# Patient Record
Sex: Male | Born: 1940 | Race: White | Hispanic: No | Marital: Married | Smoking: Current every day smoker
Health system: Southern US, Community
[De-identification: ages and names within clinical notes are randomized; demographics above are authoritative.]

## PROBLEM LIST (undated history)

## (undated) DIAGNOSIS — M1990 Unspecified osteoarthritis, unspecified site: Secondary | ICD-10-CM

## (undated) DIAGNOSIS — I639 Cerebral infarction, unspecified: Secondary | ICD-10-CM

## (undated) DIAGNOSIS — I10 Essential (primary) hypertension: Secondary | ICD-10-CM

## (undated) DIAGNOSIS — F419 Anxiety disorder, unspecified: Secondary | ICD-10-CM

## (undated) HISTORY — PX: OTHER SURGICAL HISTORY: SHX169

---

## 1993-05-18 DIAGNOSIS — I639 Cerebral infarction, unspecified: Secondary | ICD-10-CM

## 1993-05-18 HISTORY — DX: Cerebral infarction, unspecified: I63.9

## 2001-12-19 ENCOUNTER — Ambulatory Visit (HOSPITAL_COMMUNITY): Admission: RE | Admit: 2001-12-19 | Discharge: 2001-12-19 | Payer: Self-pay | Admitting: Pulmonary Disease

## 2002-05-11 ENCOUNTER — Emergency Department (HOSPITAL_COMMUNITY): Admission: EM | Admit: 2002-05-11 | Discharge: 2002-05-11 | Payer: Self-pay | Admitting: Emergency Medicine

## 2003-10-26 ENCOUNTER — Ambulatory Visit (HOSPITAL_COMMUNITY): Admission: RE | Admit: 2003-10-26 | Discharge: 2003-10-26 | Payer: Self-pay | Admitting: Orthopaedic Surgery

## 2006-11-18 ENCOUNTER — Ambulatory Visit: Payer: Self-pay | Admitting: Orthopedic Surgery

## 2006-11-25 ENCOUNTER — Ambulatory Visit (HOSPITAL_COMMUNITY): Admission: RE | Admit: 2006-11-25 | Discharge: 2006-11-25 | Payer: Self-pay | Admitting: Orthopedic Surgery

## 2006-12-01 ENCOUNTER — Ambulatory Visit: Payer: Self-pay | Admitting: Orthopedic Surgery

## 2006-12-06 ENCOUNTER — Encounter (HOSPITAL_COMMUNITY): Admission: RE | Admit: 2006-12-06 | Discharge: 2007-01-05 | Payer: Self-pay | Admitting: Orthopedic Surgery

## 2006-12-30 ENCOUNTER — Encounter (INDEPENDENT_AMBULATORY_CARE_PROVIDER_SITE_OTHER): Payer: Self-pay | Admitting: Pulmonary Disease

## 2006-12-30 ENCOUNTER — Ambulatory Visit: Payer: Self-pay | Admitting: Orthopedic Surgery

## 2007-03-14 ENCOUNTER — Ambulatory Visit (HOSPITAL_COMMUNITY): Admission: RE | Admit: 2007-03-14 | Discharge: 2007-03-14 | Payer: Self-pay | Admitting: Pulmonary Disease

## 2009-02-20 DIAGNOSIS — Z8679 Personal history of other diseases of the circulatory system: Secondary | ICD-10-CM | POA: Insufficient documentation

## 2009-02-21 ENCOUNTER — Ambulatory Visit: Payer: Self-pay | Admitting: Orthopedic Surgery

## 2009-02-21 DIAGNOSIS — M7020 Olecranon bursitis, unspecified elbow: Secondary | ICD-10-CM

## 2009-08-29 ENCOUNTER — Ambulatory Visit (HOSPITAL_COMMUNITY): Admission: RE | Admit: 2009-08-29 | Discharge: 2009-08-29 | Payer: Self-pay | Admitting: Pulmonary Disease

## 2009-10-02 ENCOUNTER — Ambulatory Visit: Payer: Self-pay | Admitting: Vascular Surgery

## 2010-05-09 ENCOUNTER — Ambulatory Visit: Payer: Self-pay | Admitting: Vascular Surgery

## 2010-09-30 NOTE — Assessment & Plan Note (Signed)
OFFICE VISIT   Dustin Huang, Dustin Huang  DOB:  1940-11-07                                       10/02/2009  ZOXWR#:[id number]   CHIEF COMPLAINT:  Leg pain.   HISTORY OF PRESENT ILLNESS:  The patient is a 70 year old male referred  by Dr. Juanetta Gosling for bilateral lower extremity pain.  The patient states  that both legs begin cramping after walking approximately 30 yards.  The  problem has been going on for 4-5 years and has become progressively  worse over time.  He denies rest pain.  He denies any ulcerations on his  feet.   CHRONIC MEDICAL PROBLEMS:  Include borderline elevated cholesterol and  hypertension, both of these are followed by Dr. Juanetta Gosling and currently  controlled.   PAST SURGICAL HISTORY:  He had some trauma to two fingers on his right  hand.  Otherwise no other operations.   FAMILY HISTORY:  Unremarkable.   SOCIAL HISTORY:  He is married.  He has four children.  He is a retired  Merchandiser, retail.  He currently smokes one pack of cigarettes per day and has  smoked for most of his life.   REVIEW OF SYSTEMS:  A 12 point review of systems was performed with the  patient today.  Please see intake referral form for details regarding  that.   MEDICATIONS:  1. Include verapamil 240 mg once a day.  2. Triamterene 25 mg once a day.  3. Xanax 0.5 mg t.i.d.  4. Aspirin 81 mg once a day.   ALLERGIES:  He has listed an allergy to mycins which cause a rash.   PHYSICAL EXAM:  Blood pressure is 146/74 in the left arm, heart rate is  51 and regular, respirations 18.  HEENT:  Unremarkable.  Neck:  Has 2+  carotid pulses without bruit.  Chest:  Clear to auscultation.  Cardiac:  Regular rate and rhythm without murmur.  Abdomen:  Soft, nontender,  nondistended.  No masses.  Extremities:  He has 2+ brachial and radial  pulses bilaterally.  He has 2+ femoral pulses bilaterally.  He has  absent popliteal and pedal pulses bilaterally.  Skin:  Has no open  ulcers or  rashes.  Neurologic:  Exam shows symmetric upper extremity and  lower extremity motor strength which is 5/5.  Musculoskeletal:  Exam  shows no major joint deformities.   I reviewed his bilateral ABIs from 08/29/2009 performed at [facility].  These were 0.59 on the right, 0.67 on the left.  Waveform  findings were possibly consistent with aortoiliac disease.   I had a lengthy discussion with the patient today regarding risk factor  modification.  Currently he has opted for conservative management with  Pletal 100 mg twice daily and a walking program of 30 minutes daily to  see whether or not he can symptoms without an intervention.  He will  follow with me in six months' time for repeat ABIs.  He will follow up  sooner if he wishes to undergo an arteriogram with possible angioplasty  and stenting if his symptoms worsen.  Greater than 3 minutes today were  spent regarding smoking cessation counseling.     Janetta Hora. Fields, MD  Electronically Signed   CEF/MEDQ  D:  10/02/2009  T:  10/03/2009  Job:  [id number]   cc:   Ramon Dredge  Patrice Paradise, Huang.D.

## 2011-07-07 ENCOUNTER — Other Ambulatory Visit (HOSPITAL_COMMUNITY): Payer: Self-pay | Admitting: Pulmonary Disease

## 2011-07-07 DIAGNOSIS — R52 Pain, unspecified: Secondary | ICD-10-CM

## 2011-07-10 ENCOUNTER — Ambulatory Visit (HOSPITAL_COMMUNITY)
Admission: RE | Admit: 2011-07-10 | Discharge: 2011-07-10 | Disposition: A | Discharge status: Home / Self Care | Payer: Medicare Other | Source: Ambulatory Visit | Attending: Pulmonary Disease | Admitting: Pulmonary Disease

## 2011-07-10 DIAGNOSIS — R109 Unspecified abdominal pain: Secondary | ICD-10-CM | POA: Insufficient documentation

## 2011-07-10 DIAGNOSIS — R52 Pain, unspecified: Secondary | ICD-10-CM

## 2011-07-10 DIAGNOSIS — I10 Essential (primary) hypertension: Secondary | ICD-10-CM | POA: Insufficient documentation

## 2013-05-23 ENCOUNTER — Other Ambulatory Visit (HOSPITAL_COMMUNITY): Payer: Self-pay | Admitting: Pulmonary Disease

## 2013-05-23 DIAGNOSIS — M545 Low back pain, unspecified: Secondary | ICD-10-CM

## 2013-05-25 ENCOUNTER — Ambulatory Visit (HOSPITAL_COMMUNITY)
Admission: RE | Admit: 2013-05-25 | Discharge: 2013-05-25 | Disposition: A | Discharge status: Home / Self Care | Payer: Medicare PPO | Source: Ambulatory Visit | Attending: Pulmonary Disease | Admitting: Pulmonary Disease

## 2013-05-25 DIAGNOSIS — M47817 Spondylosis without myelopathy or radiculopathy, lumbosacral region: Secondary | ICD-10-CM | POA: Insufficient documentation

## 2013-05-25 DIAGNOSIS — C7952 Secondary malignant neoplasm of bone marrow: Principal | ICD-10-CM

## 2013-05-25 DIAGNOSIS — M51379 Other intervertebral disc degeneration, lumbosacral region without mention of lumbar back pain or lower extremity pain: Secondary | ICD-10-CM | POA: Insufficient documentation

## 2013-05-25 DIAGNOSIS — M545 Low back pain, unspecified: Secondary | ICD-10-CM | POA: Insufficient documentation

## 2013-05-25 DIAGNOSIS — R209 Unspecified disturbances of skin sensation: Secondary | ICD-10-CM | POA: Insufficient documentation

## 2013-05-25 DIAGNOSIS — C7951 Secondary malignant neoplasm of bone: Secondary | ICD-10-CM | POA: Insufficient documentation

## 2013-05-25 DIAGNOSIS — M5137 Other intervertebral disc degeneration, lumbosacral region: Secondary | ICD-10-CM | POA: Insufficient documentation

## 2013-06-02 ENCOUNTER — Other Ambulatory Visit (HOSPITAL_COMMUNITY): Payer: Self-pay | Admitting: Pulmonary Disease

## 2013-06-02 DIAGNOSIS — C419 Malignant neoplasm of bone and articular cartilage, unspecified: Secondary | ICD-10-CM

## 2013-06-07 ENCOUNTER — Ambulatory Visit (HOSPITAL_COMMUNITY)
Admission: RE | Admit: 2013-06-07 | Discharge: 2013-06-07 | Disposition: A | Discharge status: Home / Self Care | Payer: Medicare PPO | Source: Ambulatory Visit | Attending: Pulmonary Disease | Admitting: Pulmonary Disease

## 2013-06-07 ENCOUNTER — Ambulatory Visit (HOSPITAL_COMMUNITY): Payer: Medicare PPO

## 2013-06-07 DIAGNOSIS — R918 Other nonspecific abnormal finding of lung field: Secondary | ICD-10-CM | POA: Insufficient documentation

## 2013-06-07 DIAGNOSIS — C787 Secondary malignant neoplasm of liver and intrahepatic bile duct: Secondary | ICD-10-CM | POA: Insufficient documentation

## 2013-06-07 DIAGNOSIS — C419 Malignant neoplasm of bone and articular cartilage, unspecified: Secondary | ICD-10-CM

## 2013-06-07 DIAGNOSIS — C7951 Secondary malignant neoplasm of bone: Secondary | ICD-10-CM | POA: Insufficient documentation

## 2013-06-07 DIAGNOSIS — M949 Disorder of cartilage, unspecified: Secondary | ICD-10-CM

## 2013-06-07 DIAGNOSIS — C801 Malignant (primary) neoplasm, unspecified: Secondary | ICD-10-CM | POA: Insufficient documentation

## 2013-06-07 DIAGNOSIS — R599 Enlarged lymph nodes, unspecified: Secondary | ICD-10-CM | POA: Insufficient documentation

## 2013-06-07 DIAGNOSIS — M899 Disorder of bone, unspecified: Secondary | ICD-10-CM | POA: Insufficient documentation

## 2013-06-07 DIAGNOSIS — C7952 Secondary malignant neoplasm of bone marrow: Principal | ICD-10-CM

## 2013-06-07 DIAGNOSIS — C771 Secondary and unspecified malignant neoplasm of intrathoracic lymph nodes: Secondary | ICD-10-CM | POA: Insufficient documentation

## 2013-06-07 DIAGNOSIS — J438 Other emphysema: Secondary | ICD-10-CM | POA: Insufficient documentation

## 2013-06-07 MED ORDER — SODIUM CHLORIDE 0.9 % IJ SOLN
INTRAMUSCULAR | Status: AC
  Filled 2013-06-07: qty 500

## 2013-06-07 MED ORDER — IOHEXOL 300 MG/ML  SOLN
100.0000 mL | Freq: Once | INTRAMUSCULAR | Status: AC | PRN
  Administered 2013-06-07: 100 mL via INTRAVENOUS

## 2013-06-12 ENCOUNTER — Other Ambulatory Visit: Payer: Self-pay | Admitting: Radiology

## 2013-06-12 ENCOUNTER — Other Ambulatory Visit (HOSPITAL_COMMUNITY): Payer: Self-pay | Admitting: Pulmonary Disease

## 2013-06-12 ENCOUNTER — Encounter (HOSPITAL_COMMUNITY): Payer: Self-pay | Admitting: Pharmacy Technician

## 2013-06-12 DIAGNOSIS — C799 Secondary malignant neoplasm of unspecified site: Secondary | ICD-10-CM

## 2013-06-13 ENCOUNTER — Other Ambulatory Visit (HOSPITAL_COMMUNITY): Payer: Self-pay | Admitting: Radiology

## 2013-06-14 ENCOUNTER — Other Ambulatory Visit (HOSPITAL_COMMUNITY): Payer: Self-pay | Admitting: Pulmonary Disease

## 2013-06-14 ENCOUNTER — Ambulatory Visit (HOSPITAL_COMMUNITY)
Admission: RE | Admit: 2013-06-14 | Discharge: 2013-06-14 | Disposition: A | Discharge status: Home / Self Care | Payer: Medicare PPO | Source: Ambulatory Visit | Attending: Pulmonary Disease | Admitting: Pulmonary Disease

## 2013-06-14 ENCOUNTER — Encounter (HOSPITAL_COMMUNITY): Payer: Self-pay

## 2013-06-14 DIAGNOSIS — C799 Secondary malignant neoplasm of unspecified site: Secondary | ICD-10-CM

## 2013-06-14 DIAGNOSIS — C787 Secondary malignant neoplasm of liver and intrahepatic bile duct: Secondary | ICD-10-CM | POA: Insufficient documentation

## 2013-06-14 DIAGNOSIS — C7952 Secondary malignant neoplasm of bone marrow: Secondary | ICD-10-CM

## 2013-06-14 DIAGNOSIS — C801 Malignant (primary) neoplasm, unspecified: Secondary | ICD-10-CM | POA: Insufficient documentation

## 2013-06-14 DIAGNOSIS — C7951 Secondary malignant neoplasm of bone: Secondary | ICD-10-CM | POA: Insufficient documentation

## 2013-06-14 HISTORY — DX: Essential (primary) hypertension: I10

## 2013-06-14 HISTORY — DX: Anxiety disorder, unspecified: F41.9

## 2013-06-14 LAB — PROTIME-INR
INR: 0.98 (ref 0.00–1.49)
PROTHROMBIN TIME: 12.8 s (ref 11.6–15.2)

## 2013-06-14 LAB — CBC
HEMATOCRIT: 38.3 % — AB (ref 39.0–52.0)
HEMOGLOBIN: 13.3 g/dL (ref 13.0–17.0)
MCH: 31 pg (ref 26.0–34.0)
MCHC: 34.7 g/dL (ref 30.0–36.0)
MCV: 89.3 fL (ref 78.0–100.0)
Platelets: 134 10*3/uL — ABNORMAL LOW (ref 150–400)
RBC: 4.29 MIL/uL (ref 4.22–5.81)
RDW: 13.8 % (ref 11.5–15.5)
WBC: 5.3 10*3/uL (ref 4.0–10.5)

## 2013-06-14 LAB — APTT: APTT: 27 s (ref 24–37)

## 2013-06-14 MED ORDER — MIDAZOLAM HCL 2 MG/2ML IJ SOLN
INTRAMUSCULAR | Status: AC
  Filled 2013-06-14: qty 4

## 2013-06-14 MED ORDER — FENTANYL CITRATE 0.05 MG/ML IJ SOLN
INTRAMUSCULAR | Status: AC
  Filled 2013-06-14: qty 4

## 2013-06-14 MED ORDER — FENTANYL CITRATE 0.05 MG/ML IJ SOLN
INTRAMUSCULAR | Status: AC | PRN
  Administered 2013-06-14 (×2): 25 ug via INTRAVENOUS

## 2013-06-14 MED ORDER — HYDROCODONE-ACETAMINOPHEN 5-325 MG PO TABS: 1.0000 | ORAL_TABLET | ORAL | Status: DC | PRN

## 2013-06-14 MED ORDER — MIDAZOLAM HCL 2 MG/2ML IJ SOLN
INTRAMUSCULAR | Status: AC | PRN
  Administered 2013-06-14: 1 mg via INTRAVENOUS

## 2013-06-14 MED ORDER — SODIUM CHLORIDE 0.9 % IV SOLN
Freq: Once | INTRAVENOUS | Status: AC
  Administered 2013-06-14: 13:00:00 via INTRAVENOUS

## 2013-06-14 NOTE — Procedures (Signed)
US liver lesion core 31D x3 No complication No blood loss. See complete dictation in [facility].

## 2013-06-14 NOTE — Progress Notes (Signed)
Pt received from cath procedure alert and denies any discomfort.

## 2013-06-14 NOTE — H&P (Signed)
Dustin Huang is an 72 y.o. male.   Chief Complaint: pt was suffering from back pain and weakness x weeks Work up included spinal MRI; CT chest/abd Revealed R lobe lung mass; Lymphadenopathy; Liver lesions; sternal lesion Now scheduled for liver lesion biopsy  HPI: smoker; HTN  Past Medical History  Diagnosis Date  . Hypertension   . Anxiety     History reviewed. No pertinent past surgical history.  History reviewed. No pertinent family history. Social History:  reports that he has been smoking.  He does not have any smokeless tobacco history on file. His alcohol and drug histories are not on file.  Allergies:  Allergies  Allergen Reactions  . Amoxicillin Nausea And Vomiting     (Not in a hospital admission)  No results found for this or any previous visit (from the past 48 hour(s)). No results found.  Review of Systems  Constitutional: Negative for fever and weight loss.  Respiratory: Positive for cough.   Cardiovascular: Negative for chest pain.  Gastrointestinal: Negative for nausea, vomiting and abdominal pain.  Musculoskeletal: Positive for back pain.  Neurological: Positive for weakness. Negative for dizziness and headaches.  Psychiatric/Behavioral: Positive for substance abuse.       Smoker    Blood pressure 122/58, pulse 84, temperature 97.8 F (36.6 C), temperature source Oral, resp. rate 20, height 5\' 8"  (1.727 m), weight 163 lb (73.936 kg), SpO2 98.00%. Physical Exam  Constitutional: He is oriented to person, place, and time. He appears well-developed and well-nourished.  Cardiovascular: Normal rate and regular rhythm.   No murmur heard. Respiratory: Effort normal and breath sounds normal. He has no wheezes.  GI: Soft. Bowel sounds are normal. There is no tenderness.  Musculoskeletal: Normal range of motion.  Neurological: He is alert and oriented to person, place, and time.  Skin: Skin is warm and dry.  Psychiatric: He has a normal mood and affect.  His behavior is normal. Judgment and thought content normal.     Assessment/Plan Back pain; weakness Work up reveals RUL mass; LAN; liver mets; sternal lesion Scheduled now for liver lesion bx Pt aware of procedure benefits and risks and agreeable to proceed Consent signed and in chart  Dustin Huang A 06/14/2013, 1:09 PM

## 2013-06-14 NOTE — Progress Notes (Signed)
Discharge instruction given per MD order.  Pt and CG  Verbalize understanding.  Pt to car via wheelchair.

## 2013-06-14 NOTE — Discharge Instructions (Signed)
Liver Biopsy °Care After °Refer to this sheet in the next few weeks. These discharge instructions provide you with general information on caring for yourself after you leave the hospital. Your caregiver may also give you specific instructions. Your treatment has been planned according to the most current medical practices available, but unavoidable complications sometimes occur. If you have any problems or questions after discharge, please call your caregiver. °HOME CARE INSTRUCTIONS  °· You should rest for 1 to 2 days or as instructed. °· If you go home the same day as your procedure (outpatient), have a responsible adult take you home and stay with you overnight. °· Do not lift more than 5 pounds or play contact sports for 2 weeks. °· Do not drive for 24 hours after this test. °· Do not take medicine containing aspirin or drink alcohol for 1 week after this test. °· Change bandages (dressings) as directed. °· Only take over-the-counter or prescription medicines for pain, discomfort, or fever as directed by your caregiver. °OBTAINING YOUR TEST RESULTS °Not all test results are available during your visit. If your test results are not back during the visit, make an appointment with your caregiver to find out the results. Do not assume everything is normal if you have not heard from your caregiver or the medical facility. It is important for you to follow up on all of your test results. °SEEK MEDICAL CARE IF:  °· You have increased bleeding (more than a small spot) from the biopsy site. °· You have redness, swelling, or increasing pain in the biopsy site. °· You have an oral temperature above 102° F (38.9° C). °SEEK IMMEDIATE MEDICAL CARE IF:  °· You develop swelling or pain in the belly (abdomen). °· You develop a rash. °· You have difficulty breathing, feel short of breath, or feel faint. °· You develop any reaction or side effects to medicines given. °MAKE SURE YOU:  °· Understand these instructions. °· Will watch  your condition. °· Will get help right away if you are not doing well or get worse. °Document Released: 11/21/2004 Document Revised: 07/27/2011 Document Reviewed: 12/15/2007 °ExitCare® Patient Information ©2014 [employer]. ° °

## 2013-06-23 ENCOUNTER — Encounter (HOSPITAL_COMMUNITY): Payer: Self-pay | Admitting: Pharmacy Technician

## 2013-06-23 NOTE — H&P (Signed)
  NTS SOAP Note  Vital Signs:  Vitals as of: 12/19/2547: Systolic 826: Diastolic 76: Heart Rate 415: Temp 99.1F: Height 59ft 8in: Weight 160Lbs 0 Ounces: Pain Level 8: BMI 24.33  BMI : 24.33 kg/m2  Subjective: This 73 Years 47 Months old Male presents for a portacath.  Has metastatic small cell lung carcinoma.  Needs portacath for chemotherapy.  Review of Symptoms:  Constitutional:  fatigue Head:unremarkable    Eyes:  pain bilateral Nose/Mouth/Throat:unremarkable Cardiovascular:  unremarkable   Respiratory:  wheezing,cough Gastrointestinal:  unremarkable   Genitourinary:unremarkable       joint and neck pain Skin:unremarkable Hematolgic/Lymphatic:unremarkable     Allergic/Immunologic:unremarkable     Past Medical History:    Reviewed  Past Medical History  Surgical History: none Medical Problems: , metastatic lung cancer, htn Allergies: amoxicillin Medications: verapamil, triamterene, xanax, benazapril, baby asa, hydrocodone, dexamethasone   Social History:Reviewed  Social History  Preferred Language: English Race:  White Ethnicity: Not Hispanic / Latino Age: 73 Years 4 Months Marital Status:  S Alcohol: no Recreational drug(s): no   Smoking Status: Current every day smoker reviewed on 06/22/2013 Started Date: 05/18/1960 Packs per day: 1.00 Functional Status reviewed on 06/22/2013 ------------------------------------------------ Bathing: Normal Cooking: Normal Dressing: Normal Driving: Normal Eating: Normal Managing Meds: Normal Oral Care: Normal Shopping: Normal Toileting: Normal Transferring: Normal Walking: Normal Cognitive Status reviewed on 06/22/2013 ------------------------------------------------ Attention: Normal Decision Making: Normal Language: Normal Memory: Normal Motor: Normal Perception: Normal Problem Solving: Normal Visual and Spatial: Normal   Family History:  Reviewed  Family  Health History Family History is Unknown    Objective Information: General:  Well appearing, well nourished in no distress. Heart:  RRR, no murmur     Distant lung sounds noted.  Assessment:Metastatic lung carcinoma, need for central venous access  Diagnoses: 162.9 Carcinoma of lung (Malignant neoplasm of unspecified part of unspecified bronchus or lung)  Procedures: 83094 - OFFICE OUTPATIENT NEW 20 MINUTES    Plan:  Scheduled for portacath insertion on 06/28/13.   Patient Education:Alternative treatments to surgery were discussed with patient (and family).  Risks and benefits  of procedure including bleeding, infection, and pneumothorax were fully explained to the patient (and family) who gave informed consent. Patient/family questions were addressed.  Follow-up:Pending Surgery

## 2013-06-26 ENCOUNTER — Encounter (HOSPITAL_COMMUNITY)
Admission: RE | Admit: 2013-06-26 | Discharge: 2013-06-26 | Disposition: A | Discharge status: Home / Self Care | Payer: Medicare PPO | Source: Ambulatory Visit | Attending: General Surgery | Admitting: General Surgery

## 2013-06-26 ENCOUNTER — Encounter (HOSPITAL_COMMUNITY): Payer: Self-pay

## 2013-06-26 ENCOUNTER — Other Ambulatory Visit: Payer: Self-pay

## 2013-06-26 HISTORY — DX: Cerebral infarction, unspecified: I63.9

## 2013-06-26 HISTORY — DX: Unspecified osteoarthritis, unspecified site: M19.90

## 2013-06-26 NOTE — Patient Instructions (Signed)
Dustin Huang  06/26/2013   Your procedure is scheduled on:  06/28/2013  Report to [facility] at  1050  AM.  Call this number if you have problems the morning of surgery: [phone]   Remember:   Do not eat food or drink liquids after midnight.   Take these medicines the morning of surgery with A SIP OF WATER:  Xanax, benazepril, decadron, hydrocodone, triamterene, verapamil   Do not wear jewelry, make-up or nail polish.  Do not wear lotions, powders, or perfumes.   Do not shave 48 hours prior to surgery. Men may shave face and neck.  Do not bring valuables to the hospital.  [facility] is not responsible for any belongings or valuables.               Contacts, dentures or bridgework may not be worn into surgery.  Leave suitcase in the car. After surgery it may be brought to your room.  For patients admitted to the hospital, discharge time is determined by your treatment team.               Patients discharged the day of surgery will not be allowed to drive home.  Name and phone number of your driver: family  Special Instructions: Shower using CHG 2 nights before surgery and the night before surgery.  If you shower the day of surgery use CHG.  Use special wash - you have one bottle of CHG for all showers.  You should use approximately 1/3 of the bottle for each shower.   Please read over the following fact sheets that you were given: Pain Booklet, Coughing and Deep Breathing, Surgical Site Infection Prevention, Anesthesia Post-op Instructions and Care and Recovery After Surgery Implanted Port Insertion An implanted port is a central line that has a round shape and is placed under the skin. It is used as a long-term IV access for:   Medicines, such as chemotherapy.   Fluids.   Liquid nutrition, such as total parenteral nutrition (TPN).   Blood samples.  LET [facility] CARE PROVIDER KNOW ABOUT:  Allergies to food or medicine.   Medicines taken, including  vitamins, herbs, eye drops, creams, and over-the-counter medicines.   Any allergies to heparin.  Use of steroids (by mouth or creams).   Previous problems with anesthetics or numbing medicines.   History of bleeding problems or blood clots.   Previous surgery.   Other health problems, including diabetes and kidney problems.   Possibility of pregnancy, if this applies. RISKS AND COMPLICATIONS  Damage to the blood vessel, bruising, or bleeding at the puncture site.   Infection.  Blood clot in the vessel that the port is in.  Breakdown of the skin over your port.  Very rarely a person may develop a condition called a pneumothorax, a collection of air in the chest that may cause one of the lungs to collapse. The placement of these catheters with the appropriate imaging guidance significantly decreases the risk of a pneumothorax.  BEFORE THE PROCEDURE   Your health care provider may want you to have blood tests. These tests can help tell how well your kidneys and liver are working. They can also show how well your blood clots.   If you take blood thinners (anticoagulant medicines), ask your health care provider when you should stop taking them.   Make arrangements for someone to drive you home. This is necessary if you have been sedated for  your procedure.  PROCEDURE  Port insertion usually takes about 30 45 minutes.   An IV needle will be inserted in your arm. Medicine for pain and medicine to help relax you (sedative) will flow directly into your body through this needle.   You will lie on an exam table, and you will be connected to monitors to keep track of your heart rate, blood pressure, and breathing throughout the procedure.  An oxygen monitoring device may be attached to your finger. Oxygen will be given.   Everything is kept as germ free (sterile) as possible during the procedure. The skin near the point of the incision will be cleaned with antiseptic, and  the area will be draped with sterile towels. The skin and deeper tissues over the port area will be made numb with a local anesthetic.  Two small cuts (incisions) will be made in the skin to insert the port. One will be made in the neck to obtain access to the vein where the catheter will lie.   Because the port reservoir is placed under the skin, a small skin incision is made in the upper chest, and a small pocket for the port is made under the skin. The catheter connected to the port tunnels to a large central vein in the chest. A small, raised area remains on your body at the site of the reservoir when the procedure is complete.  The port placement is done under imaging guidance to ensure the proper placement.  The reservoir has a silicone covering that can be punctured with a special needle.   The port is flushed with normal saline, and blood is drawn to make sure it is working properly.  There is nothing remaining outside the skin when the procedure is finished.   Incisions are held together by stitches, surgical glue, or a special tape.  AFTER THE PROCEDURE  You will stay in a recovery area until the anesthesia has worn off. Your blood pressure and pulse will be checked.  A final chest X-ray is taken to check the placement of the port and that there is no injury to your lung.  If there are no problems, you should be able to go home after the procedure.  Document Released: 02/22/2013 Document Reviewed: 01/09/2013 [facility] Patient Information 2014 [place], [place]. Implanted [facility] Guide An implanted port is a type of central line that is placed under the skin. Central lines are used to provide IV access when treatment or nutrition needs to be given through a person's veins. Implanted ports are used for long-term IV access. An implanted port may be placed because:   You need IV medicine that would be irritating to the small veins in your hands or arms.   You need long-term  IV medicines, such as antibiotics.   You need IV nutrition for a long period.   You need frequent blood draws for lab tests.   You need dialysis.  Implanted ports are usually placed in the chest area, but they can also be placed in the upper arm, the abdomen, or the leg. An implanted port has two main parts:   Reservoir. The reservoir is round and will appear as a small, raised area under your skin. The reservoir is the part where a needle is inserted to give medicines or draw blood.   Catheter. The catheter is a thin, flexible tube that extends from the reservoir. The catheter is placed into a large vein. Medicine that is inserted into the  reservoir goes into the catheter and then into the vein.  HOW WILL I CARE FOR MY INCISION SITE? Do not get the incision site wet. Bathe or shower as directed by your health care provider.  HOW IS MY PORT ACCESSED? Special steps must be taken to access the port:   Before the port is accessed, a numbing cream can be placed on the skin. This helps numb the skin over the port site.   Your health care provider uses a sterile technique to access the port.  Your health care provider must put on a mask and sterile gloves.  The skin over your port is cleaned carefully with an antiseptic and allowed to dry.  The port is gently pinched between sterile gloves, and a needle is inserted into the port.  Only "non-coring" port needles should be used to access the port. Once the port is accessed, a blood return should be checked. This helps ensure that the port is in the vein and is not clogged.   If your port needs to remain accessed for a constant infusion, a clear (transparent) bandage will be placed over the needle site. The bandage and needle will need to be changed every week, or as directed by your health care provider.   Keep the bandage covering the needle clean and dry. Do not get it wet. Follow your health care provider's instructions on how to  take a shower or bath while the port is accessed.   If your port does not need to stay accessed, no bandage is needed over the port.  WHAT IS FLUSHING? Flushing helps keep the port from getting clogged. Follow your health care provider's instructions on how and when to flush the port. Ports are usually flushed with saline solution or a medicine called heparin. The need for flushing will depend on how the port is used.   If the port is used for intermittent medicines or blood draws, the port will need to be flushed:   After medicines have been given.   After blood has been drawn.   As part of routine maintenance.   If a constant infusion is running, the port may not need to be flushed.  HOW LONG WILL MY PORT STAY IMPLANTED? The port can stay in for as long as your health care provider thinks it is needed. When it is time for the port to come out, surgery will be done to remove it. The procedure is similar to the one performed when the port was put in.  WHEN SHOULD I SEEK IMMEDIATE MEDICAL CARE? When you have an implanted port, you should seek immediate medical care if:   You notice a bad smell coming from the incision site.   You have swelling, redness, or drainage at the incision site.   You have more swelling or pain at the port site or the surrounding area.   You have a fever that is not controlled with medicine. Document Released: 05/04/2005 Document Revised: 02/22/2013 Document Reviewed: 01/09/2013 [facility] Patient Information 2014 [place]. PATIENT INSTRUCTIONS POST-ANESTHESIA  IMMEDIATELY FOLLOWING SURGERY:  Do not drive or operate machinery for the first twenty four hours after surgery.  Do not make any important decisions for twenty four hours after surgery or while taking narcotic pain medications or sedatives.  If you develop intractable nausea and vomiting or a severe headache please notify your doctor immediately.  FOLLOW-UP:  Please make an appointment  with your surgeon as instructed. You do not need to follow  up with anesthesia unless specifically instructed to do so.  WOUND CARE INSTRUCTIONS (if applicable):  Keep a dry clean dressing on the anesthesia/puncture wound site if there is drainage.  Once the wound has quit draining you may leave it open to air.  Generally you should leave the bandage intact for twenty four hours unless there is drainage.  If the epidural site drains for more than 36-48 hours please call the anesthesia department.  QUESTIONS?:  Please feel free to call your physician or the hospital operator if you have any questions, and they will be happy to assist you.

## 2013-06-28 ENCOUNTER — Ambulatory Visit (HOSPITAL_COMMUNITY): Payer: Medicare PPO

## 2013-06-28 ENCOUNTER — Encounter (HOSPITAL_COMMUNITY): Payer: Self-pay

## 2013-06-28 ENCOUNTER — Ambulatory Visit (HOSPITAL_COMMUNITY)
Admission: RE | Admit: 2013-06-28 | Discharge: 2013-06-28 | Disposition: A | Discharge status: Home / Self Care | Payer: Medicare PPO | Source: Ambulatory Visit | Attending: General Surgery | Admitting: General Surgery

## 2013-06-28 ENCOUNTER — Encounter (HOSPITAL_COMMUNITY)
Admission: RE | Disposition: A | Discharge status: Home / Self Care | Payer: Self-pay | Source: Ambulatory Visit | Attending: General Surgery

## 2013-06-28 ENCOUNTER — Encounter (HOSPITAL_COMMUNITY): Payer: Medicare PPO | Admitting: Anesthesiology

## 2013-06-28 ENCOUNTER — Ambulatory Visit (HOSPITAL_COMMUNITY): Payer: Medicare PPO | Admitting: Anesthesiology

## 2013-06-28 DIAGNOSIS — I10 Essential (primary) hypertension: Secondary | ICD-10-CM | POA: Insufficient documentation

## 2013-06-28 DIAGNOSIS — Z7982 Long term (current) use of aspirin: Secondary | ICD-10-CM | POA: Insufficient documentation

## 2013-06-28 DIAGNOSIS — C787 Secondary malignant neoplasm of liver and intrahepatic bile duct: Secondary | ICD-10-CM | POA: Insufficient documentation

## 2013-06-28 DIAGNOSIS — Z79899 Other long term (current) drug therapy: Secondary | ICD-10-CM | POA: Insufficient documentation

## 2013-06-28 DIAGNOSIS — C3490 Malignant neoplasm of unspecified part of unspecified bronchus or lung: Secondary | ICD-10-CM | POA: Insufficient documentation

## 2013-06-28 HISTORY — PX: PORTACATH PLACEMENT: SHX2246

## 2013-06-28 SURGERY — INSERTION, TUNNELED CENTRAL VENOUS DEVICE, WITH PORT
Anesthesia: Monitor Anesthesia Care | Site: Chest | Laterality: Left

## 2013-06-28 MED ORDER — VANCOMYCIN HCL IN DEXTROSE 1-5 GM/200ML-% IV SOLN
1000.0000 mg | INTRAVENOUS | Status: AC
  Administered 2013-06-28: 1000 mg via INTRAVENOUS

## 2013-06-28 MED ORDER — LIDOCAINE HCL (PF) 1 % IJ SOLN
INTRAMUSCULAR | Status: AC
  Filled 2013-06-28: qty 30

## 2013-06-28 MED ORDER — MIDAZOLAM HCL 2 MG/2ML IJ SOLN
1.0000 mg | INTRAMUSCULAR | Status: DC | PRN
  Administered 2013-06-28: 2 mg via INTRAVENOUS

## 2013-06-28 MED ORDER — HEPARIN SOD (PORK) LOCK FLUSH 100 UNIT/ML IV SOLN
INTRAVENOUS | Status: DC | PRN
  Administered 2013-06-28: 500 [IU] via INTRAVENOUS

## 2013-06-28 MED ORDER — HEPARIN SOD (PORK) LOCK FLUSH 100 UNIT/ML IV SOLN
INTRAVENOUS | Status: AC
  Filled 2013-06-28: qty 5

## 2013-06-28 MED ORDER — FENTANYL CITRATE 0.05 MG/ML IJ SOLN
INTRAMUSCULAR | Status: AC
  Filled 2013-06-28: qty 2

## 2013-06-28 MED ORDER — VANCOMYCIN HCL IN DEXTROSE 1-5 GM/200ML-% IV SOLN
INTRAVENOUS | Status: AC
  Filled 2013-06-28: qty 200

## 2013-06-28 MED ORDER — ONDANSETRON HCL 4 MG/2ML IJ SOLN: 4.0000 mg | Freq: Once | INTRAMUSCULAR | Status: DC | PRN

## 2013-06-28 MED ORDER — PROPOFOL INFUSION 10 MG/ML OPTIME
INTRAVENOUS | Status: DC | PRN
  Administered 2013-06-28: 100 ug/kg/min via INTRAVENOUS

## 2013-06-28 MED ORDER — CHLORHEXIDINE GLUCONATE 4 % EX LIQD: 1.0000 "application " | Freq: Once | CUTANEOUS | Status: DC

## 2013-06-28 MED ORDER — HYDROCODONE-ACETAMINOPHEN 7.5-325 MG PO TABS: 1.0000 | ORAL_TABLET | ORAL | Status: AC | PRN

## 2013-06-28 MED ORDER — LACTATED RINGERS IV SOLN
INTRAVENOUS | Status: DC
  Administered 2013-06-28: 12:00:00 via INTRAVENOUS

## 2013-06-28 MED ORDER — PROPOFOL 10 MG/ML IV BOLUS
INTRAVENOUS | Status: AC
  Filled 2013-06-28: qty 20

## 2013-06-28 MED ORDER — KETOROLAC TROMETHAMINE 30 MG/ML IJ SOLN
30.0000 mg | Freq: Once | INTRAMUSCULAR | Status: AC
  Administered 2013-06-28: 30 mg via INTRAVENOUS
  Filled 2013-06-28: qty 1

## 2013-06-28 MED ORDER — FENTANYL CITRATE 0.05 MG/ML IJ SOLN: 25.0000 ug | INTRAMUSCULAR | Status: DC | PRN

## 2013-06-28 MED ORDER — SODIUM CHLORIDE 0.9 % IV SOLN
INTRAVENOUS | Status: DC | PRN
  Administered 2013-06-28: 500 mL via INTRAMUSCULAR

## 2013-06-28 MED ORDER — FENTANYL CITRATE 0.05 MG/ML IJ SOLN
25.0000 ug | INTRAMUSCULAR | Status: AC
  Administered 2013-06-28 (×2): 25 ug via INTRAVENOUS

## 2013-06-28 MED ORDER — MIDAZOLAM HCL 2 MG/2ML IJ SOLN
INTRAMUSCULAR | Status: AC
  Filled 2013-06-28: qty 2

## 2013-06-28 MED ORDER — FENTANYL CITRATE 0.05 MG/ML IJ SOLN
INTRAMUSCULAR | Status: DC | PRN
  Administered 2013-06-28: 12.5 ug via INTRAVENOUS
  Administered 2013-06-28 (×2): 25 ug via INTRAVENOUS
  Administered 2013-06-28: 12.5 ug via INTRAVENOUS

## 2013-06-28 MED ORDER — LIDOCAINE HCL (PF) 1 % IJ SOLN
INTRAMUSCULAR | Status: DC | PRN
  Administered 2013-06-28: 9 mL

## 2013-06-28 SURGICAL SUPPLY — 34 items
ADH SKN CLS APL DERMABOND .7 (GAUZE/BANDAGES/DRESSINGS) ×1
BAG DECANTER FOR FLEXI CONT (MISCELLANEOUS) ×3 IMPLANT
BAG HAMPER (MISCELLANEOUS) ×3 IMPLANT
CLOTH BEACON ORANGE TIMEOUT ST (SAFETY) ×3 IMPLANT
COVER LIGHT HANDLE STERIS (MISCELLANEOUS) ×6 IMPLANT
DECANTER SPIKE VIAL GLASS SM (MISCELLANEOUS) ×3 IMPLANT
DERMABOND ADVANCED (GAUZE/BANDAGES/DRESSINGS) ×2
DERMABOND ADVANCED .7 DNX12 (GAUZE/BANDAGES/DRESSINGS) ×1 IMPLANT
DRAPE C-ARM FOLDED MOBILE STRL (DRAPES) ×3 IMPLANT
DURAPREP 6ML APPLICATOR 50/CS (WOUND CARE) ×3 IMPLANT
ELECT REM PT RETURN 9FT ADLT (ELECTROSURGICAL) ×3
ELECTRODE REM PT RTRN 9FT ADLT (ELECTROSURGICAL) ×1 IMPLANT
GLOVE BIO SURGEON STRL SZ7.5 (GLOVE) ×3 IMPLANT
GLOVE BIOGEL PI IND STRL 7.0 (GLOVE) IMPLANT
GLOVE BIOGEL PI INDICATOR 7.0 (GLOVE) ×2
GLOVE ECLIPSE 6.5 STRL STRAW (GLOVE) ×2 IMPLANT
GLOVE EXAM NITRILE LRG STRL (GLOVE) ×2 IMPLANT
GOWN STRL REUS W/TWL LRG LVL3 (GOWN DISPOSABLE) ×6 IMPLANT
IV NS 500ML (IV SOLUTION) ×3
IV NS 500ML BAXH (IV SOLUTION) ×1 IMPLANT
KIT PORT POWER 8FR ISP MRI (CATHETERS) ×3 IMPLANT
KIT ROOM TURNOVER APOR (KITS) ×3 IMPLANT
MANIFOLD NEPTUNE II (INSTRUMENTS) ×3 IMPLANT
NDL HYPO 25X1 1.5 SAFETY (NEEDLE) ×1 IMPLANT
NEEDLE HYPO 25X1 1.5 SAFETY (NEEDLE) ×3 IMPLANT
PACK MINOR (CUSTOM PROCEDURE TRAY) ×3 IMPLANT
PAD ARMBOARD 7.5X6 YLW CONV (MISCELLANEOUS) ×3 IMPLANT
SET BASIN LINEN APH (SET/KITS/TRAYS/PACK) ×3 IMPLANT
SUT VIC AB 3-0 SH 27 (SUTURE) ×3
SUT VIC AB 3-0 SH 27X BRD (SUTURE) ×1 IMPLANT
SUT VIC AB 4-0 PS2 27 (SUTURE) ×3 IMPLANT
SYR 20CC LL (SYRINGE) ×3 IMPLANT
SYR CONTROL 10ML LL (SYRINGE) ×3 IMPLANT
SYRINGE 10CC LL (SYRINGE) ×3 IMPLANT

## 2013-06-28 NOTE — Anesthesia Procedure Notes (Signed)
Procedure Name: MAC Date/Time: 06/28/2013 12:14 PM Performed by: Vista Deck Pre-anesthesia Checklist: Patient identified, Emergency Drugs available, Suction available, Timeout performed and Patient being monitored Patient Re-evaluated:Patient Re-evaluated prior to inductionOxygen Delivery Method: Nasal Cannula

## 2013-06-28 NOTE — Interval H&P Note (Signed)
History and Physical Interval Note:  06/28/2013 10:02 AM  Dustin Huang  has presented today for surgery, with the diagnosis of lung cancer  The various methods of treatment have been discussed with the patient and family. After consideration of risks, benefits and other options for treatment, the patient has consented to  Procedure(s): INSERTION PORT-A-CATH (N/A) as a surgical intervention .  The patient's history has been reviewed, patient examined, no change in status, stable for surgery.  I have reviewed the patient's chart and labs.  Questions were answered to the patient's satisfaction.     Aviva Signs A

## 2013-06-28 NOTE — Op Note (Signed)
Patient:  Dustin Huang  DOB:  05-19-1940  MRN:  [id number]   Preop Diagnosis:  Metastatic lung carcinoma  Postop Diagnosis:  Same  Procedure:  Port-A-Cath insertion  Surgeon:  Aviva Signs, M.D.  Anes:  MAC  Indications:  Patient is a 73 year old white male who presents for a Port-A-Cath insertion. He needs central venous access to undergo chemotherapy for metastatic lung cancer. The risks and benefits of the procedure including bleeding, infection, and the possibility of a pneumothorax were fully explained to the patient, who gave informed consent.  Procedure note:  The patient was placed in Trendelenburg position after the left upper chest was prepped and draped using usual sterile technique with DuraPrep. Surgical site confirmation was performed. 1% Xylocaine was used for local anesthesia.  An incision was made below left clavicle. Subcutaneous pocket was then formed. A needle is advanced into left subclavian vein using the Seldinger technique without difficulty. A guidewire was then advanced into the right atrium under fluoroscopic guidance. An introducer peel-away sheath were placed over the guidewire. The guidewire was removed. The catheter was then inserted through the peel-away sheath the peel-away sheath was removed. The catheter was attached to the port and the port placed in subcutaneous pocket. Adequate positioning was confirmed by fluoroscopy. Good backflow was noted in the port. The port was flushed with heparin flush. The subcutaneous layer was reapproximated using a 4-0 Vicryl interrupted suture. The skin was closed using a 4-0 Vicryl subcuticular suture. Dermabond was then applied.  All tape and needle counts were correct at the end of the procedure. Patient was transferred to PACU in stable condition. A chest x-ray will be performed at that time.  Complications:  None  EBL:  Minimal  Specimen:  None

## 2013-06-28 NOTE — Discharge Instructions (Signed)
Implanted Port Insertion, Care After Refer to this sheet in the next few weeks. These instructions provide you with information on caring for yourself after your procedure. Your health care provider may also give you more specific instructions. Your treatment has been planned according to current medical practices, but problems sometimes occur. Call your health care provider if you have any problems or questions after your procedure. WHAT TO EXPECT AFTER THE PROCEDURE After your procedure, it is typical to have the following:   Discomfort at the port insertion site. Ice packs to the area will help.  Bruising on the skin over the port. This will subside in 3 4 days. HOME CARE INSTRUCTIONS  After your port is placed, you will get a manufacturer's information card. The card has information about your port. Keep this card with you at all times.   Know what kind of port you have. There are many types of ports available.   Wear a medical alert bracelet in case of an emergency. This can help alert health care workers that you have a port.   The port can stay in for as long as your health care provider believes it is necessary.   A home health care nurse may give medicines and take care of the port.   You or a family member can get special training and directions for giving medicine and taking care of the port at home.  SEEK MEDICAL CARE IF:  Your port does not flush or you are unable to get a blood return.   SEEK IMMEDIATE MEDICAL CARE IF:  You have new fluid or pus coming from your incision.   You notice a bad smell coming from your incision site.   You have swelling, pain, or more redness at the incision or port site.   You have a fever or chills.   You have chest pain or shortness of breath. Document Released: 02/22/2013 Document Reviewed: 01/09/2013 [facility] Patient Information 2014 [place], [place]. Implanted [employer] Guide An implanted port is a type of central line  that is placed under the skin. Central lines are used to provide IV access when treatment or nutrition needs to be given through a person's veins. Implanted ports are used for long-term IV access. An implanted port may be placed because:   You need IV medicine that would be irritating to the small veins in your hands or arms.   You need long-term IV medicines, such as antibiotics.   You need IV nutrition for a long period.   You need frequent blood draws for lab tests.   You need dialysis.  Implanted ports are usually placed in the chest area, but they can also be placed in the upper arm, the abdomen, or the leg. An implanted port has two main parts:   Reservoir. The reservoir is round and will appear as a small, raised area under your skin. The reservoir is the part where a needle is inserted to give medicines or draw blood.   Catheter. The catheter is a thin, flexible tube that extends from the reservoir. The catheter is placed into a large vein. Medicine that is inserted into the reservoir goes into the catheter and then into the vein.  HOW WILL I CARE FOR MY INCISION SITE? Do not get the incision site wet. Bathe or shower as directed by your health care provider.  HOW IS MY PORT ACCESSED? Special steps must be taken to access the port:   Before the port is accessed,  a numbing cream can be placed on the skin. This helps numb the skin over the port site.   Your health care provider uses a sterile technique to access the port.  Your health care provider must put on a mask and sterile gloves.  The skin over your port is cleaned carefully with an antiseptic and allowed to dry.  The port is gently pinched between sterile gloves, and a needle is inserted into the port.  Only "non-coring" port needles should be used to access the port. Once the port is accessed, a blood return should be checked. This helps ensure that the port is in the vein and is not clogged.   If your port  needs to remain accessed for a constant infusion, a clear (transparent) bandage will be placed over the needle site. The bandage and needle will need to be changed every week, or as directed by your health care provider.   Keep the bandage covering the needle clean and dry. Do not get it wet. Follow your health care provider's instructions on how to take a shower or bath while the port is accessed.   If your port does not need to stay accessed, no bandage is needed over the port.  WHAT IS FLUSHING? Flushing helps keep the port from getting clogged. Follow your health care provider's instructions on how and when to flush the port. Ports are usually flushed with saline solution or a medicine called heparin. The need for flushing will depend on how the port is used.   If the port is used for intermittent medicines or blood draws, the port will need to be flushed:   After medicines have been given.   After blood has been drawn.   As part of routine maintenance.   If a constant infusion is running, the port may not need to be flushed.  HOW LONG WILL MY PORT STAY IMPLANTED? The port can stay in for as long as your health care provider thinks it is needed. When it is time for the port to come out, surgery will be done to remove it. The procedure is similar to the one performed when the port was put in.  WHEN SHOULD I SEEK IMMEDIATE MEDICAL CARE? When you have an implanted port, you should seek immediate medical care if:   You notice a bad smell coming from the incision site.   You have swelling, redness, or drainage at the incision site.   You have more swelling or pain at the port site or the surrounding area.   You have a fever that is not controlled with medicine. Document Released: 05/04/2005 Document Revised: 02/22/2013 Document Reviewed: 01/09/2013 [facility] Patient Information 2014 [place].

## 2013-06-28 NOTE — Transfer of Care (Signed)
Immediate Anesthesia Transfer of Care Note  Patient: Dustin Huang  Procedure(s) Performed: Procedure(s) (LRB): INSERTION PORT-A-CATH (N/A)  Patient Location: PACU  Anesthesia Type: MAC  Level of Consciousness: awake  Airway & Oxygen Therapy: Patient Spontanous Breathing.   Post-op Assessment: Report given to PACU RN, Post -op Vital signs reviewed and stable and Patient moving all extremities  Post vital signs: Reviewed and stable  Complications: No apparent anesthesia complications

## 2013-06-28 NOTE — Anesthesia Postprocedure Evaluation (Signed)
Anesthesia Post Note  Patient: Dustin Huang  Procedure(s) Performed: Procedure(s) (LRB): INSERTION PORT-A-CATH (N/A)  Anesthesia type: MAC  Patient location: PACU  Post pain: Pain level controlled  Post assessment: Post-op Vital signs reviewed, Patient's Cardiovascular Status Stable, Respiratory Function Stable, Patent Airway, No signs of Nausea or vomiting and Pain level controlled  Last Vitals:  Filed Vitals:   06/28/13 1254  BP: 105/57  Pulse: 72  Temp: 36.8 C  Resp: 11    Post vital signs: Reviewed and stable  Level of consciousness: awake and alert   Complications: No apparent anesthesia complications

## 2013-06-28 NOTE — Anesthesia Preprocedure Evaluation (Signed)
Anesthesia Evaluation  Patient identified by MRN, date of birth, ID band Patient awake    Reviewed: Allergy & Precautions, H&P , NPO status , Patient's Chart, lab work & pertinent test results  Airway Mallampati: II TM Distance: >3 FB     Dental  (+) Edentulous Upper, Edentulous Lower   Pulmonary Current Smoker,  Lung cancer breath sounds clear to auscultation        Cardiovascular hypertension, Pt. on medications Rhythm:Regular Rate:Normal     Neuro/Psych Anxiety CVA, No Residual Symptoms    GI/Hepatic negative GI ROS,   Endo/Other    Renal/GU      Musculoskeletal   Abdominal   Peds  Hematology   Anesthesia Other Findings   Reproductive/Obstetrics                           Anesthesia Physical Anesthesia Plan  ASA: III  Anesthesia Plan: MAC   Post-op Pain Management:    Induction: Intravenous  Airway Management Planned: Simple Face Mask  Additional Equipment:   Intra-op Plan:   Post-operative Plan: Extubation in OR  Informed Consent: I have reviewed the patients History and Physical, chart, labs and discussed the procedure including the risks, benefits and alternatives for the proposed anesthesia with the patient or authorized representative who has indicated his/her understanding and acceptance.     Plan Discussed with:   Anesthesia Plan Comments:         Anesthesia Quick Evaluation

## 2013-06-29 ENCOUNTER — Encounter (HOSPITAL_COMMUNITY): Payer: Self-pay | Admitting: General Surgery

## 2013-08-16 DEATH — deceased

## 2014-05-07 IMAGING — CT CT ABD-PELV W/ CM
2 of 4 series · 13 of 36 positions shown, 16 images · IV contrast (omnipaque)
Comparison: None.

CLINICAL DATA: Bone metastases of unknown primary.

EXAM:
CT CHEST, ABDOMEN, AND PELVIS WITH CONTRAST
TECHNIQUE: Multidetector CT imaging of the chest, abdomen and pelvis was
performed following the standard protocol during bolus
administration of intravenous contrast.
CONTRAST:  100mL OMNIPAQUE IOHEXOL 300 MG/ML  SOLN

[Series 2: cap with 5.0 b40f · axial · 0.73mm/px · z∈[-564,-24]mm · 10 of 127 slices shown, 13 images]
[im 13/127  mediastinal]
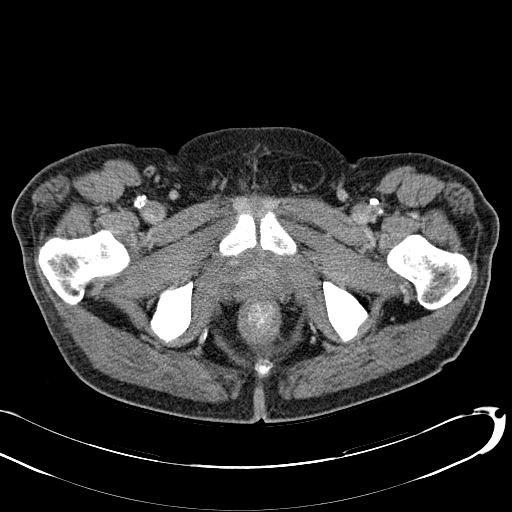
[im 13/127  lung]
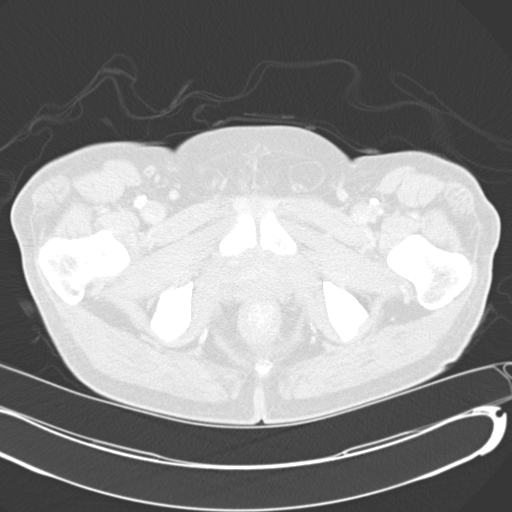
[im 25/127  lung]
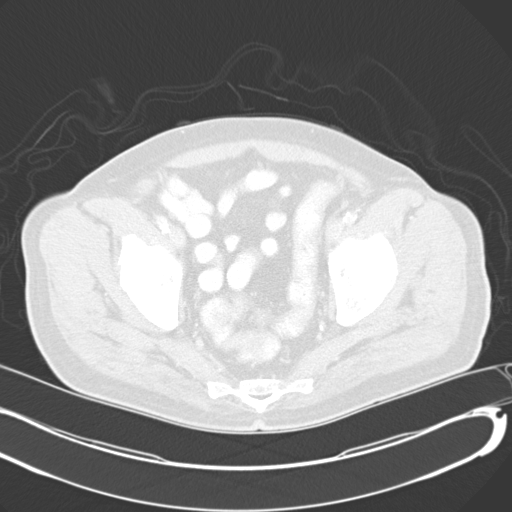
[im 37/127  lung]
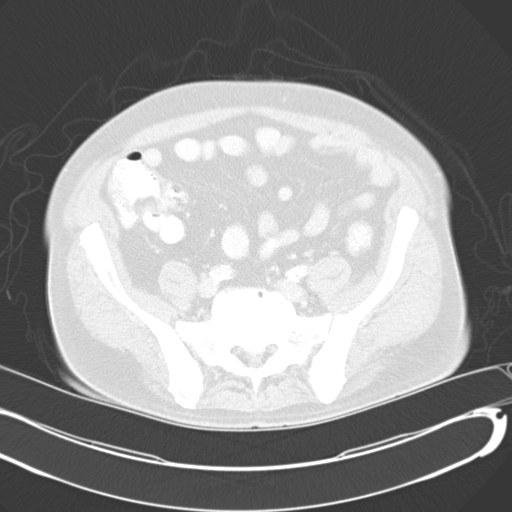
[im 49/127  lung]
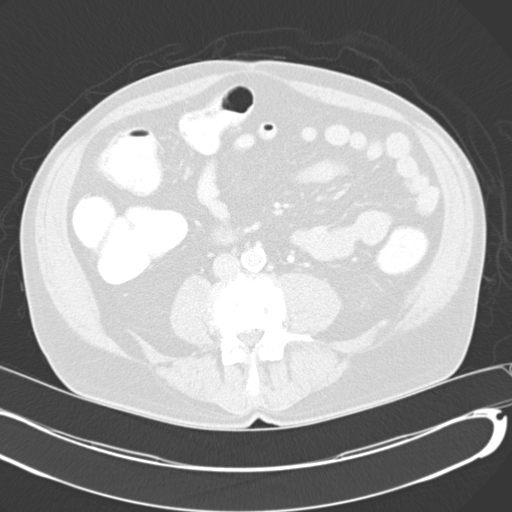
[im 61/127  mediastinal]
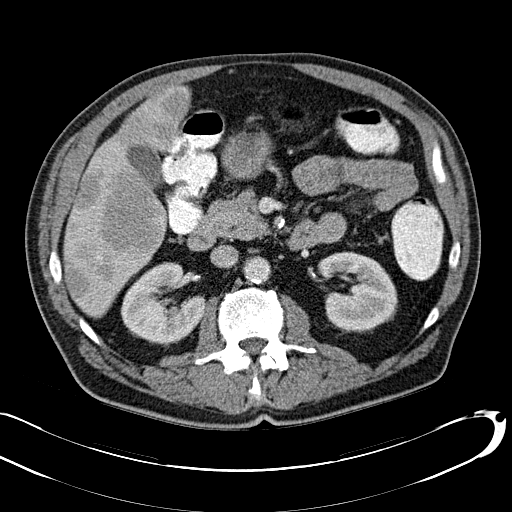
[im 61/127  lung]
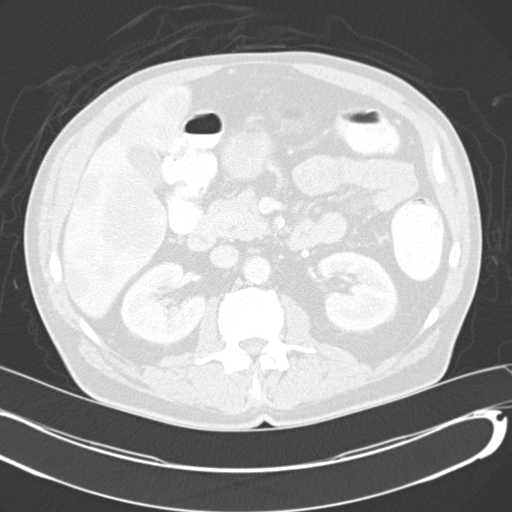
[im 73/127  lung]
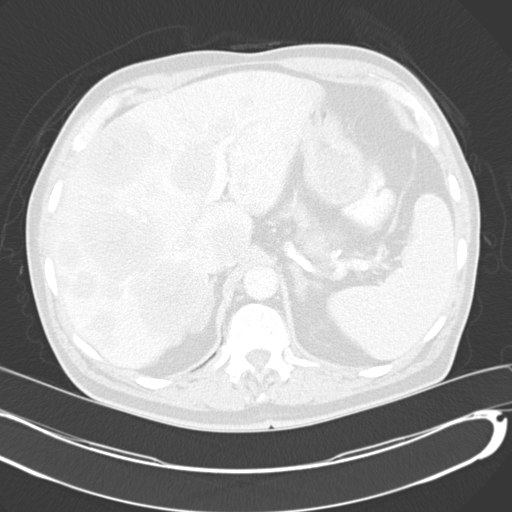
[im 85/127  lung]
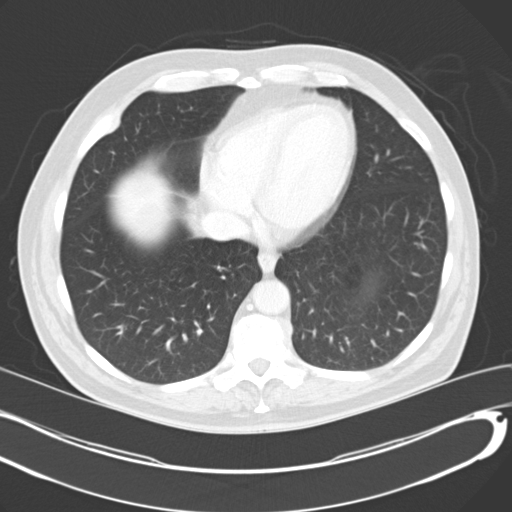
[im 97/127  lung]
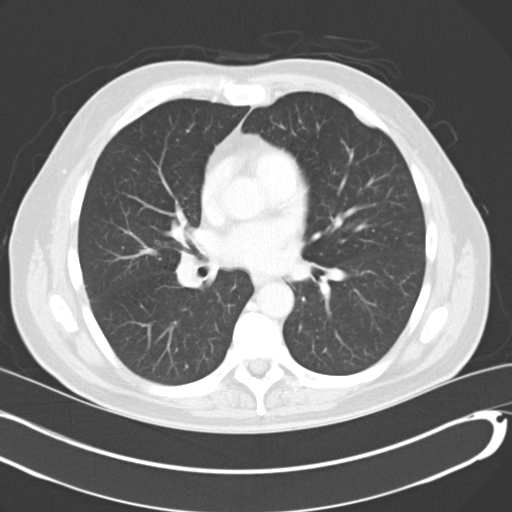
[im 109/127  mediastinal]
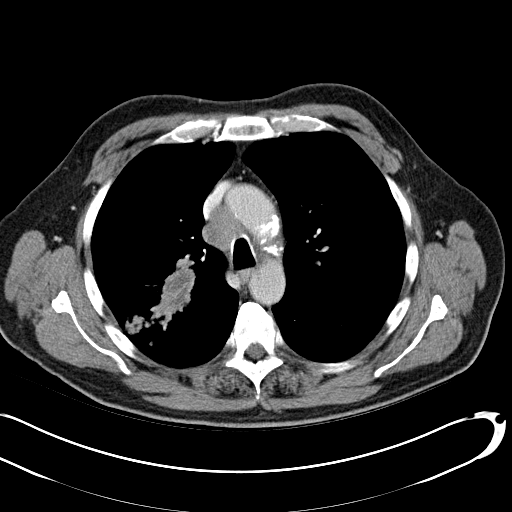
[im 109/127  lung]
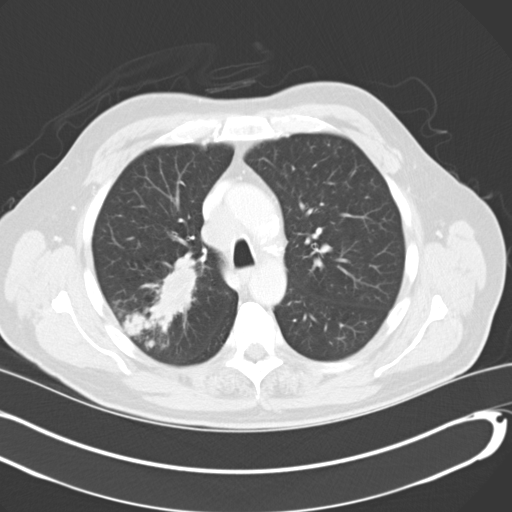
[im 121/127  lung]
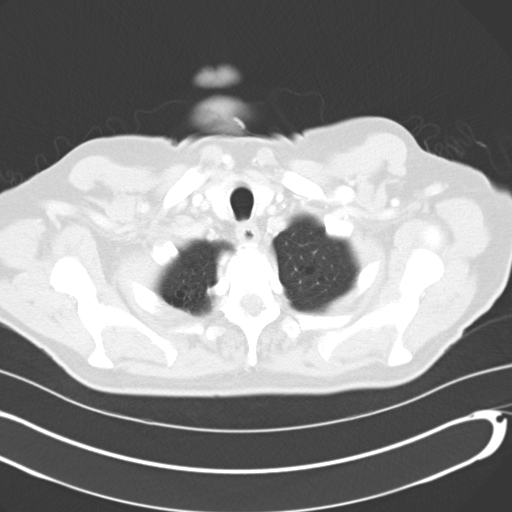

[Series 4: mpr cor post contrast (id) · coronal · 0.80mm/px · 3 of 91 slices shown]
[im 19/91  lung]
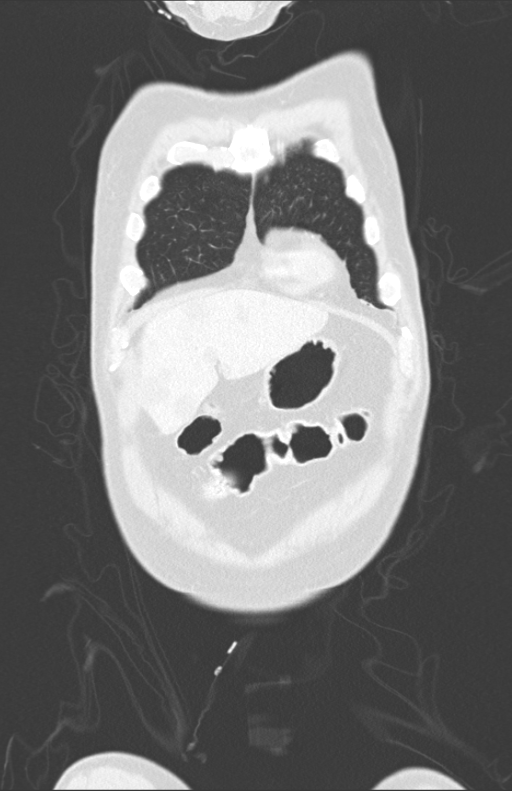
[im 37/91  lung]
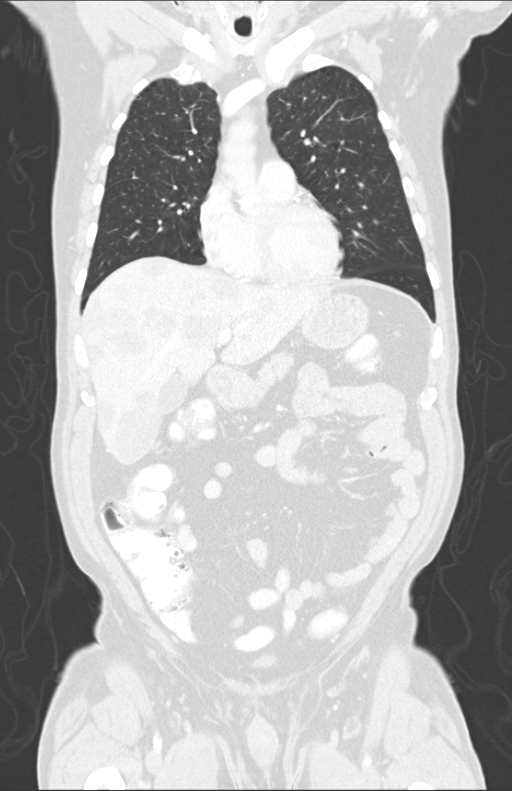
[im 55/91  lung]
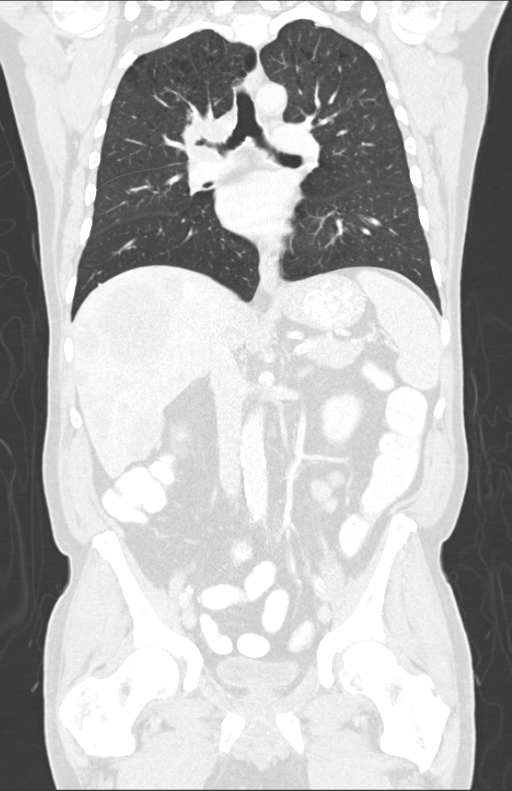

[13 of 36 positions shown; findings below may reference images not displayed]

FINDINGS: CT CHEST FINDINGS

A lobulated mass is seen in the posterior right upper lobe abutting
the major fissure which measures 2.7 x 6.9 cm. Additional smaller
satellite nodules are seen in the peripheral right upper lobe. This
mass extends centrally to the right hilum, and there is right hilar
lymphadenopathy which measures 2.2 x 3.4 cm on image 26. There is
also mediastinal lymphadenopathy in the right paratracheal region
measuring 3.2 x 3.5 cm on image 21. No contralateral left hilar
lymphadenopathy or axillary lymphadenopathy identified.

Other small noncalcified pulmonary nodules are seen in the right
middle lobe measuring 6 mm on image 34, the medial right middle lobe
on image 39 measuring 5 mm and in the posterior right lower lobe
measuring 8 mm on image 46. These are indeterminate.

Mild emphysema noted. No evidence of pleural or pericardial
effusion. A poorly defined sclerotic lesion seen in the sternal
manubrium is suspicious for a bone metastasis.

CT ABDOMEN AND PELVIS FINDINGS

Innumerable hypovascular masses are seen throughout the right and
left hepatic lobes, consistent with diffuse liver metastases.
Largest in the inferior right hepatic lobe measures 7.2 cm.

The adrenal glands, pancreas, spleen, and kidneys are normal in
appearance. No evidence of hydronephrosis.

Mild lymphadenopathy is seen in the porta hepatis with largest lymph
node measuring 1.3 cm on image 60. No other sites of lymphadenopathy
identified within the abdomen or pelvis.

No evidence of inflammatory process or abnormal fluid collections.
No evidence of bowel wall thickening or dilatation. No suspicious
bone lesions identified.
IMPRESSION: Large 6.9 cm mass in the posterior right upper lobe, with additional
small satellite nodules peripherally. This consistent with
bronchogenic carcinoma.

Metastatic lymphadenopathy in the right hilum and right paratracheal
region. No evidence of pleural effusion.

Other smaller less than 1 cm indeterminate pulmonary nodules in the
right middle and lower lobes.

Diffuse liver metastases. Mild porta hepatis lymphadenopathy, also
consistent with metastatic disease.

Subtle sclerotic lesion in the sternal manubrium, suspicious for
bone metastasis.

## 2014-05-14 IMAGING — US US BIOPSY
1 series · 13 of 18 positions shown · non-contrast
Comparison: none

CLINICAL DATA: Multiple liver and bone metastases, unknown primary.

EXAM:
ULTRASOUND-GUIDED CORE LIVER BIOPSY
TECHNIQUE: An ultrasound guided liver biopsy was thoroughly discussed with the
patient and questions were answered. The benefits, risks,
alternatives, and complications were also discussed. The patient
understands and wishes to proceed with the procedure. A verbal as
well as written consent was obtained.

[Series 1: us biopsy · 0.25mm/px · 13 of 18 slices shown]
[im 1/18]
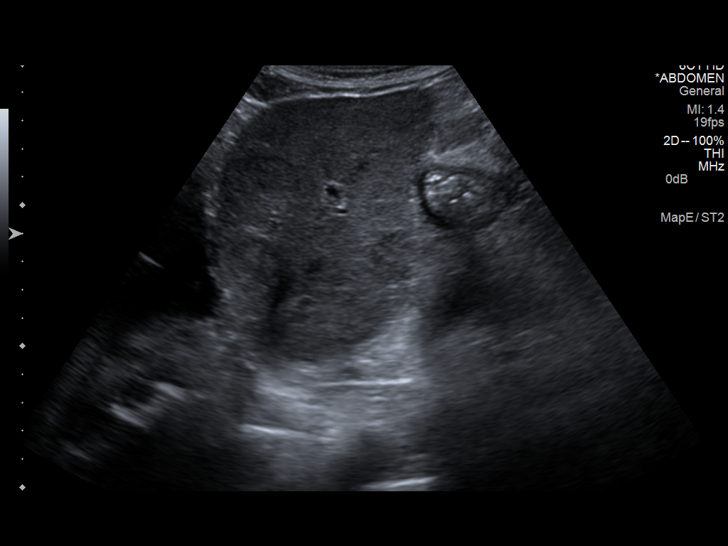
[im 3/18]
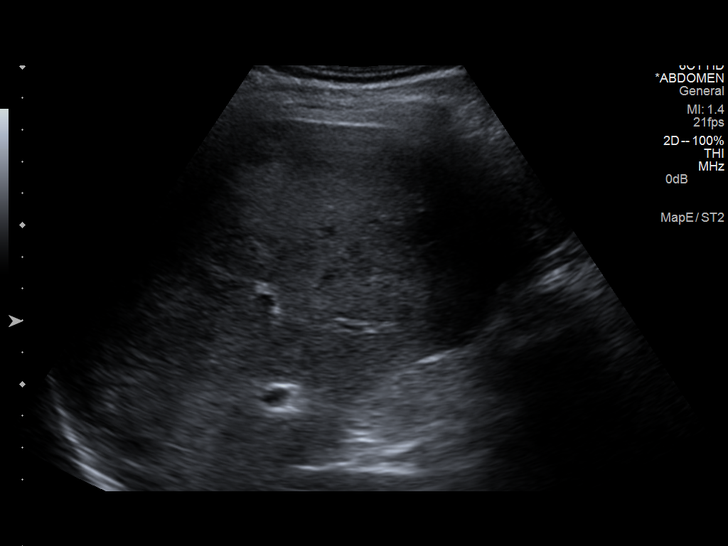
[im 4/18]
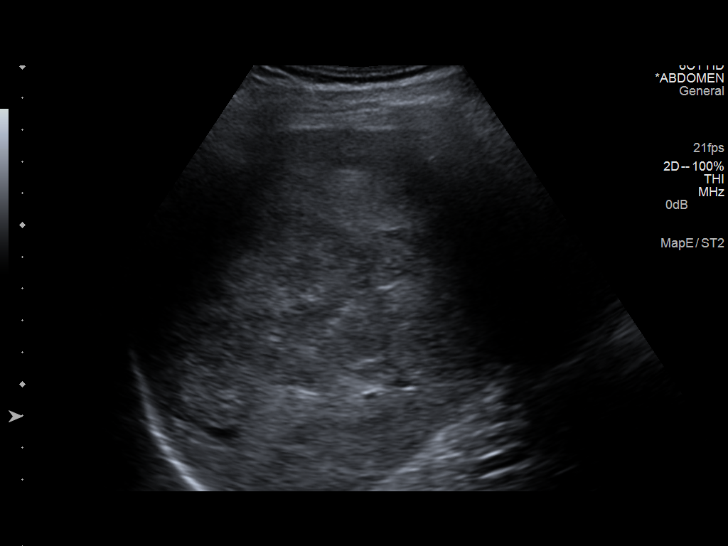
[im 5/18]
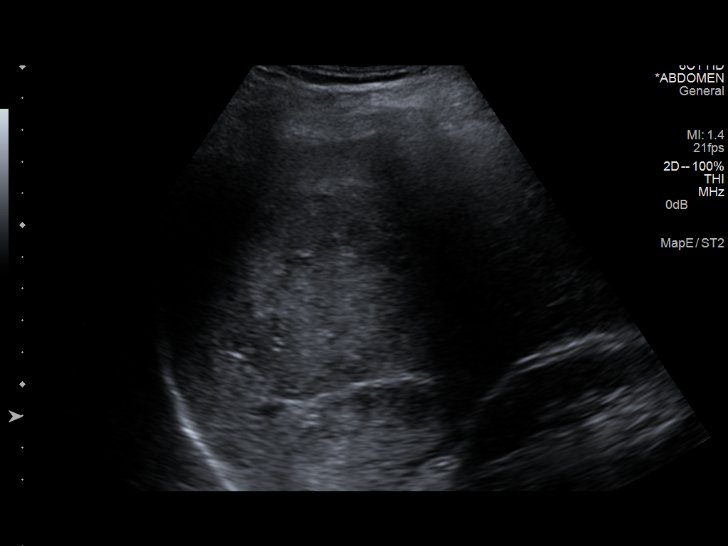
[im 7/18]
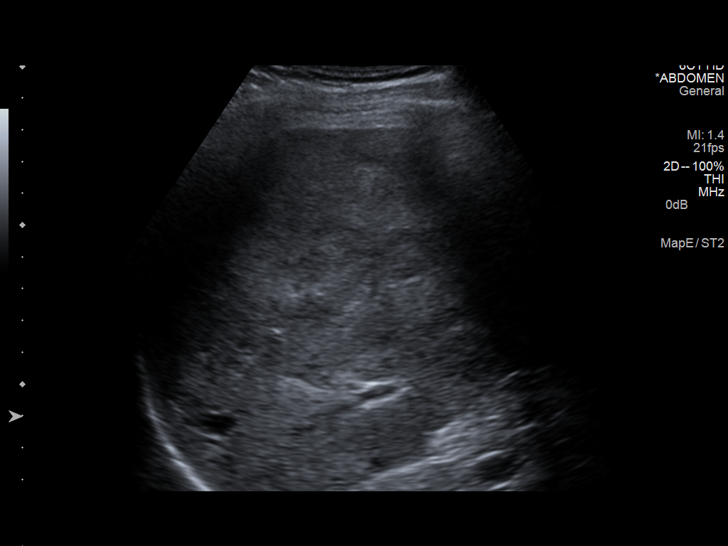
[im 8/18]
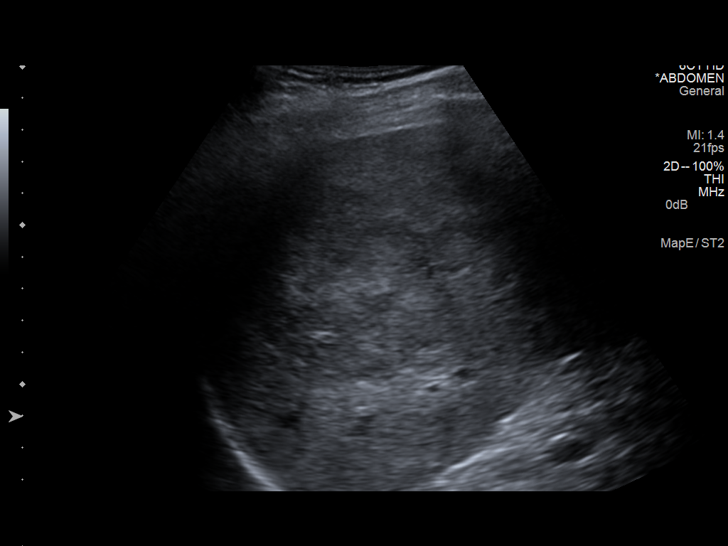
[im 10/18]
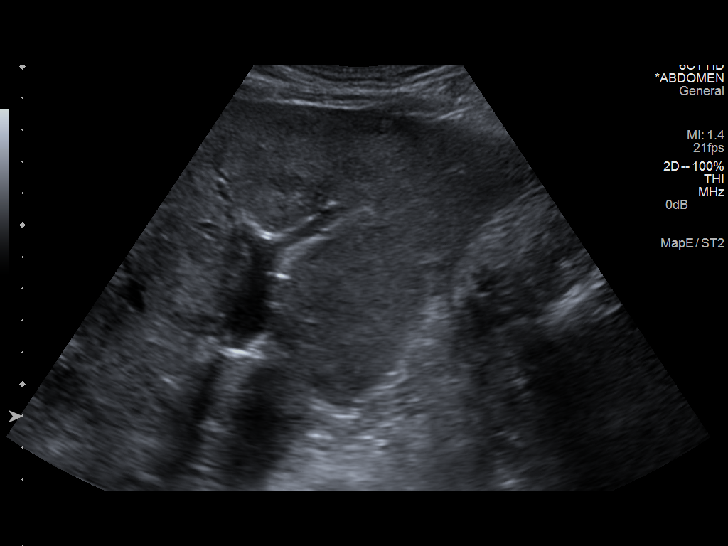
[im 11/18]
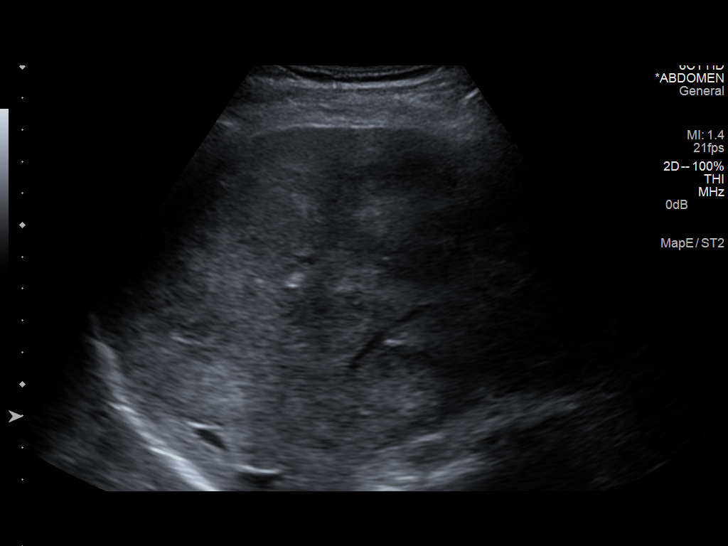
[im 12/18]
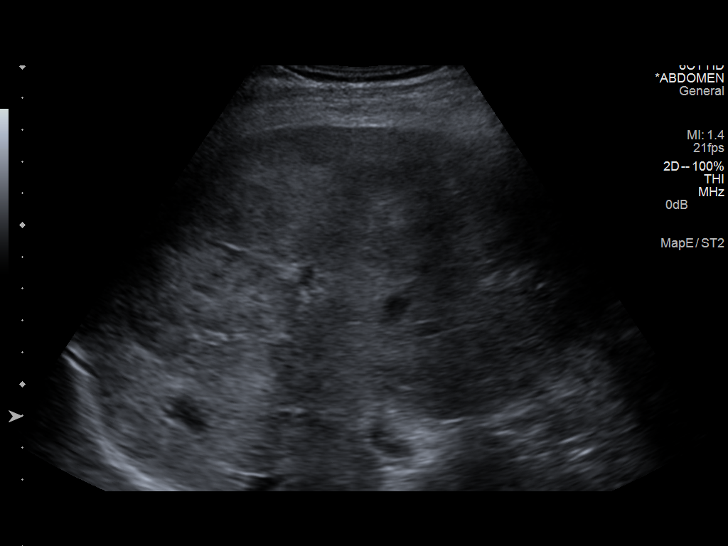
[im 14/18]
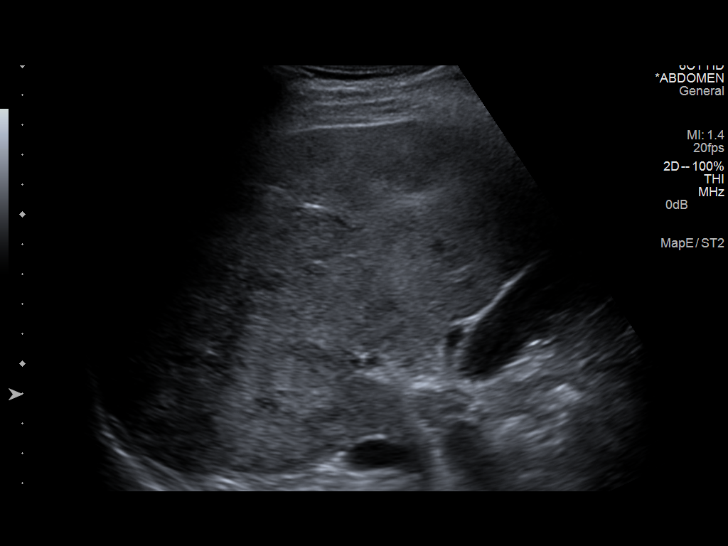
[im 15/18]
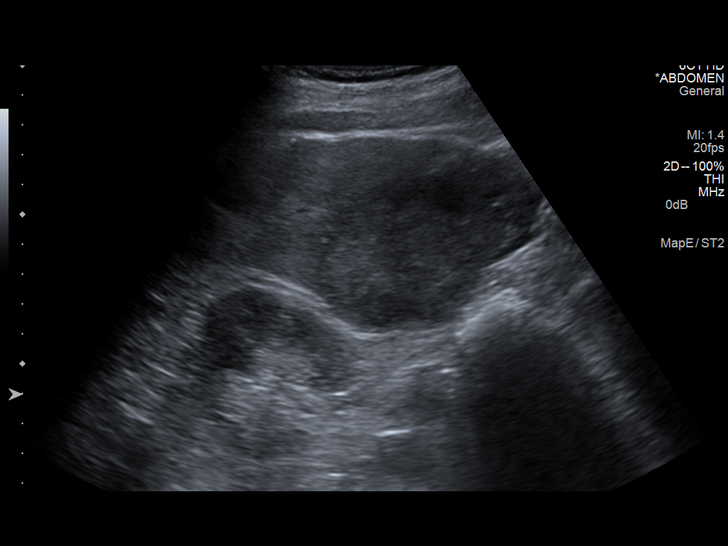
[im 16/18]
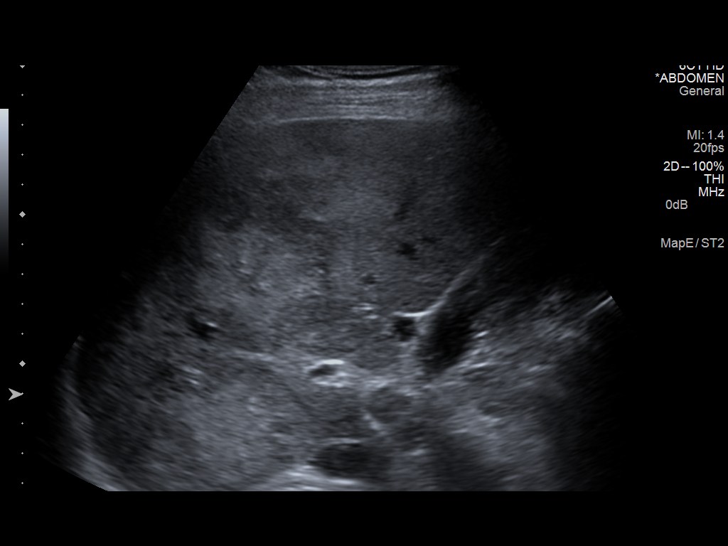
[im 18/18]
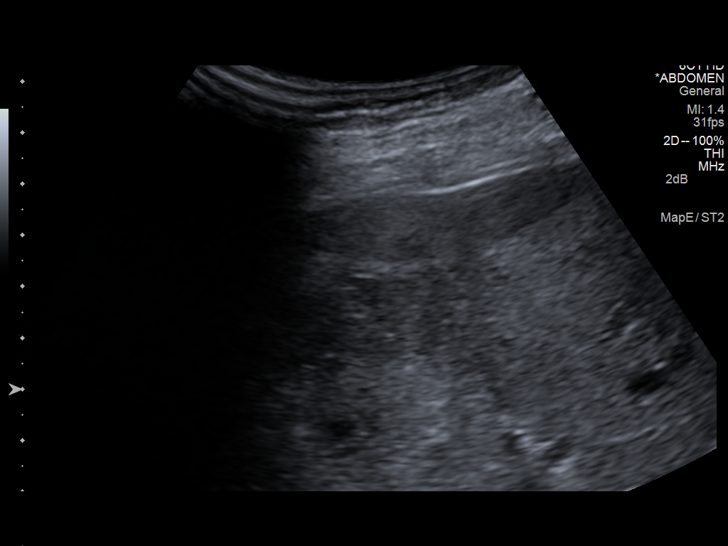

[13 of 18 positions shown; findings below may reference images not displayed]

Survey ultrasound of the liver was performed and an appropriate skin
entry site was determined. Skin site was marked, prepped with
Betadine, and draped in usual sterile fashion, and infiltrated
locally with 1% lidocaine.

Intravenous Fentanyl and Versed were administered as conscious
sedation during continuous cardiorespiratory monitoring by the
radiology RN, with a total moderate sedation time of ten minutes.

A 17 gauge trocar needle was advanced under ultrasound guidance into
the liver. Three coaxial 73gauge core samples were then obtained
through the guide needle. The guide needle was removed. Post
procedure scans demonstrate no apparent complication.
FINDINGS: Multiple liver lesions corresponding to those seen on recent CT.
Percutaneous ultrasound-guided core biopsy performed of a
representative right lobe lesion.
IMPRESSION: 1. Technically successful ultrasound guided core liver biopsy, right
liver lesion.
